# Patient Record
Sex: Female | Born: 1975 | Hispanic: No | Marital: Married
Health system: Southern US, Community
[De-identification: ages and names within clinical notes are randomized; demographics above are authoritative.]

---

## 2012-10-02 ENCOUNTER — Other Ambulatory Visit (HOSPITAL_COMMUNITY): Payer: Self-pay | Admitting: Obstetrics & Gynecology

## 2012-10-02 DIAGNOSIS — N979 Female infertility, unspecified: Secondary | ICD-10-CM

## 2012-10-02 DIAGNOSIS — Q506 Other congenital malformations of fallopian tube and broad ligament: Secondary | ICD-10-CM

## 2012-10-04 ENCOUNTER — Ambulatory Visit (HOSPITAL_COMMUNITY)
Admission: RE | Admit: 2012-10-04 | Discharge: 2012-10-04 | Disposition: A | Discharge status: Home / Self Care | Payer: BC Managed Care – PPO | Source: Ambulatory Visit | Attending: Obstetrics & Gynecology | Admitting: Obstetrics & Gynecology

## 2012-10-04 DIAGNOSIS — Q506 Other congenital malformations of fallopian tube and broad ligament: Secondary | ICD-10-CM

## 2012-10-04 DIAGNOSIS — N979 Female infertility, unspecified: Secondary | ICD-10-CM

## 2012-10-04 MED ORDER — IOHEXOL 300 MG/ML  SOLN
12.0000 mL | Freq: Once | INTRAMUSCULAR | Status: AC | PRN
  Administered 2012-10-04: 12 mL

## 2013-04-23 IMAGING — RF DG HYSTEROGRAM
8 series · 8 of 8 positions shown · IV contrast (omnipaque)
Comparison: none

CLINICAL DATA: Primary infertility.

HYSTEROSALPINGOGRAM
TECHNIQUE: Following cleansing of the cervix and vagina with
Betadine solution, a hysterosalpingogram was performed using a 5-
French hysterosalpingogram catheter and Omnipaque 300 contrast.
The patient tolerated the exam without difficulty.
Fluoroscopy time:  0.9 minutes

[Series 1: run · 1 of 1 slices shown (1 of 8)]
[im 1/1]
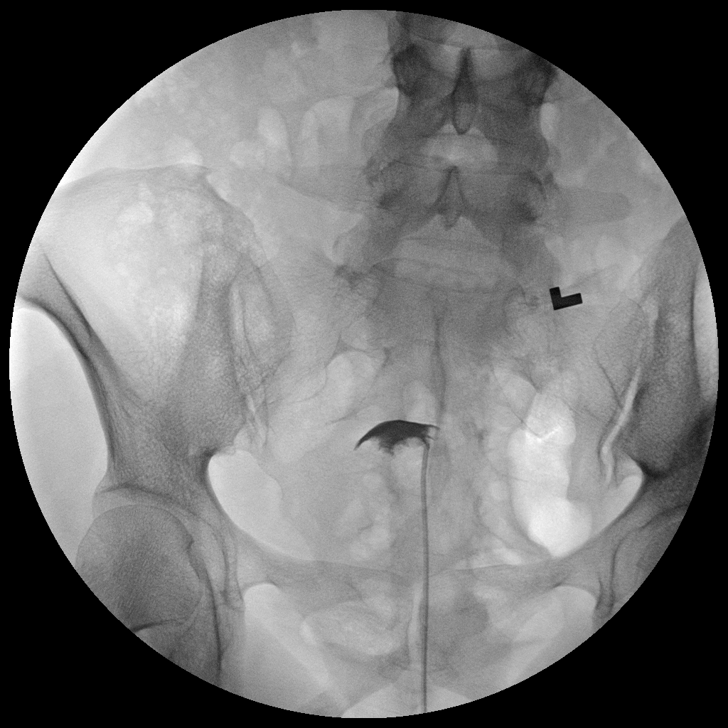

[Series 2: run · 1 of 1 slices shown (2 of 8)]
[im 1/1]
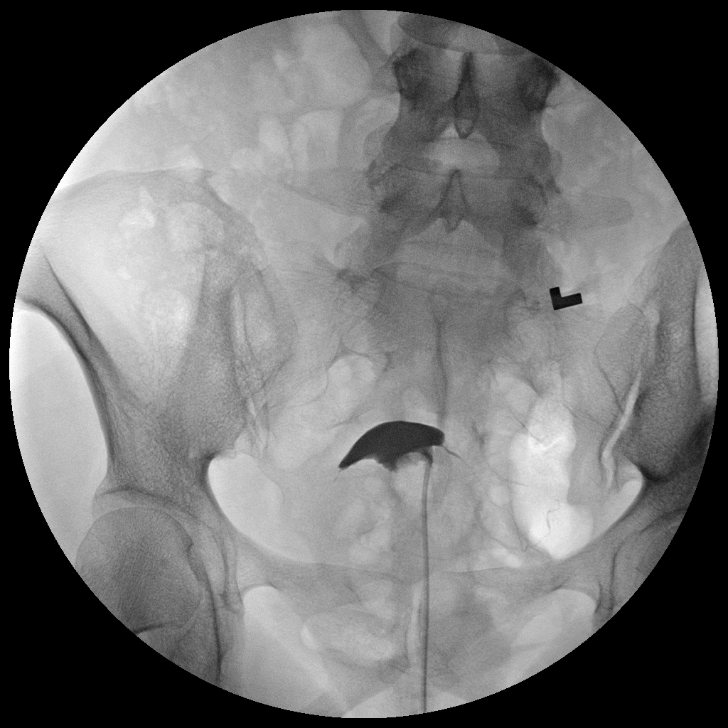

[Series 3: run · 1 of 1 slices shown (3 of 8)]
[im 1/1]
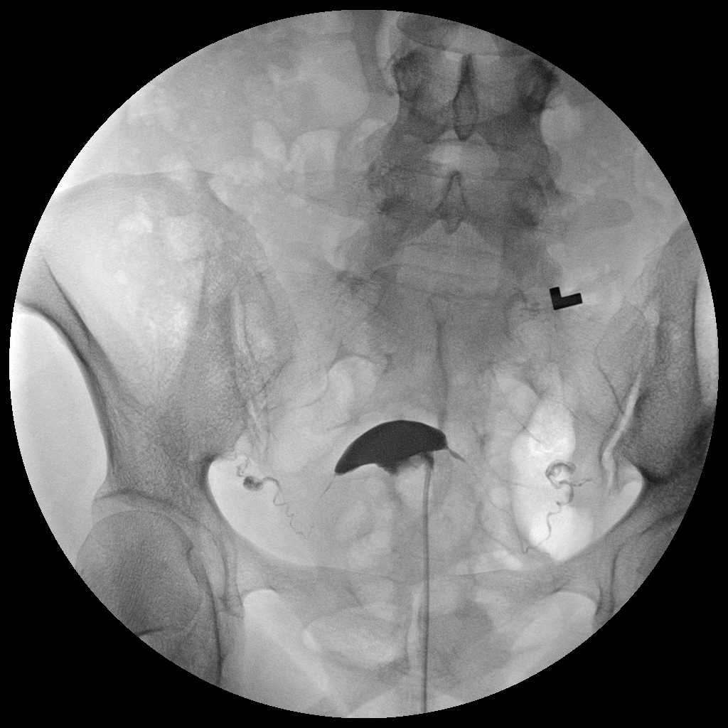

[Series 4: run · 1 of 1 slices shown (4 of 8)]
[im 1/1]
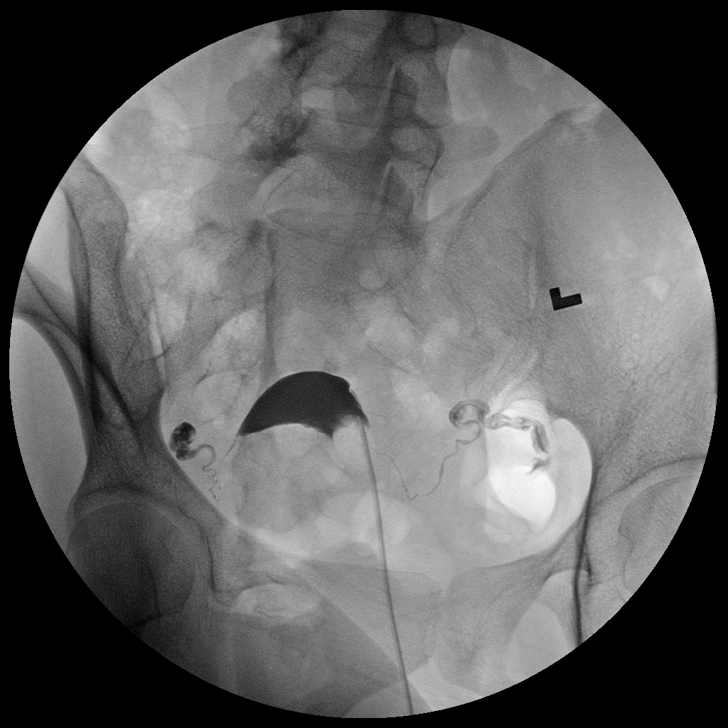

[Series 5: run · 1 of 1 slices shown (5 of 8)]
[im 1/1]
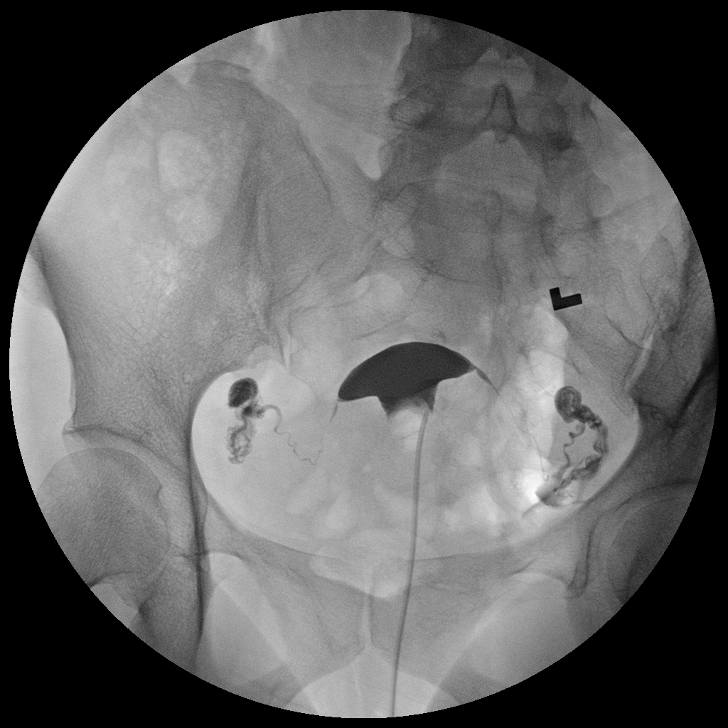

[Series 6: run · 1 of 1 slices shown (6 of 8)]
[im 1/1]
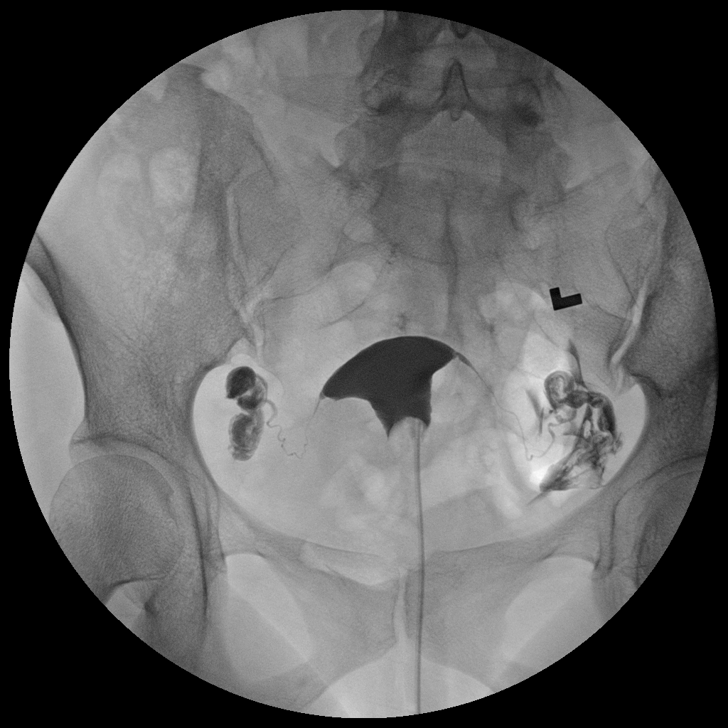

[Series 7: run · 1 of 1 slices shown (7 of 8)]
[im 1/1]
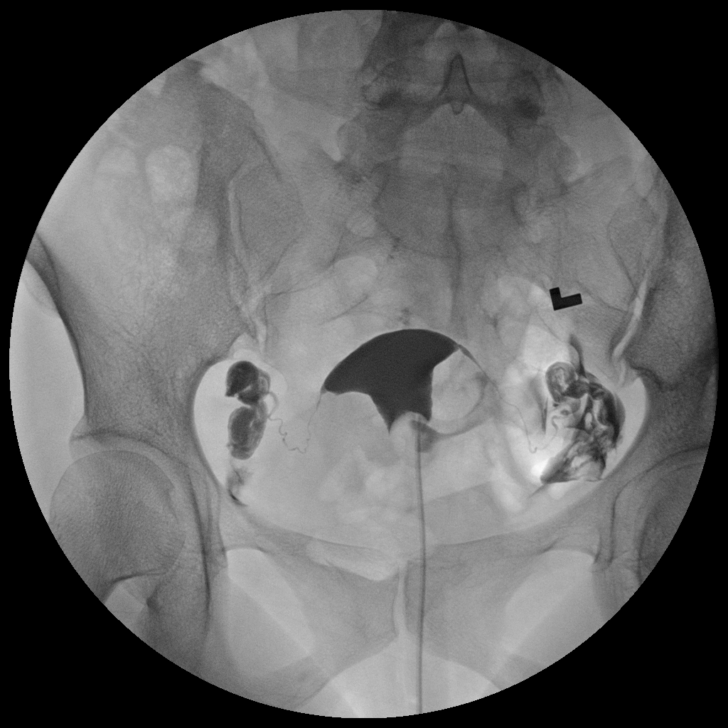

[Series 8: run · 1 of 1 slices shown (8 of 8)]
[im 1/1]
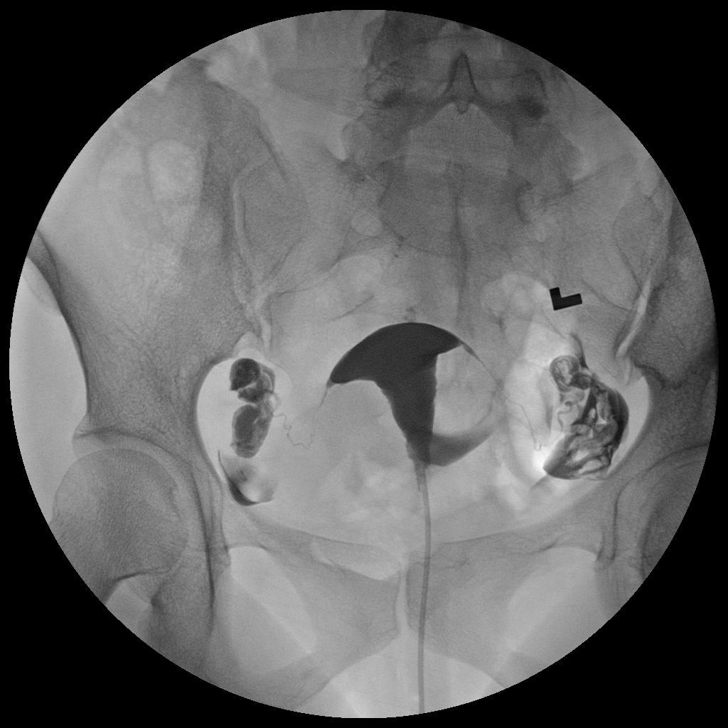

[8 of 8 positions shown; findings below may reference images not displayed]

FINDINGS: The endometrial cavity of the uterus is normal in contour
and appearance.

Contrast filling of both fallopian tubes is seen, and both tubes
are normal in appearance.  Intraperitoneal spill of contrast from
both fallopian tubes is demonstrated.
IMPRESSION: Normal study.  Fallopian tubes are patent bilaterally.

## 2013-08-23 ENCOUNTER — Other Ambulatory Visit: Payer: Self-pay | Admitting: Family Medicine

## 2013-12-16 ENCOUNTER — Other Ambulatory Visit: Payer: Self-pay

## 2014-03-20 ENCOUNTER — Other Ambulatory Visit (HOSPITAL_COMMUNITY): Payer: Self-pay | Admitting: Specialist

## 2014-03-20 DIAGNOSIS — IMO0001 Reserved for inherently not codable concepts without codable children: Secondary | ICD-10-CM

## 2014-03-20 DIAGNOSIS — O365920 Maternal care for other known or suspected poor fetal growth, second trimester, not applicable or unspecified: Secondary | ICD-10-CM

## 2014-03-20 DIAGNOSIS — O30002 Twin pregnancy, unspecified number of placenta and unspecified number of amniotic sacs, second trimester: Secondary | ICD-10-CM

## 2014-03-21 ENCOUNTER — Encounter (HOSPITAL_COMMUNITY): Payer: Self-pay

## 2014-03-21 ENCOUNTER — Other Ambulatory Visit (HOSPITAL_COMMUNITY): Payer: Self-pay | Admitting: Specialist

## 2014-03-21 ENCOUNTER — Ambulatory Visit (HOSPITAL_COMMUNITY)
Admission: RE | Admit: 2014-03-21 | Discharge: 2014-03-21 | Disposition: A | Discharge status: Home / Self Care | Payer: BC Managed Care – PPO | Source: Ambulatory Visit | Attending: Specialist | Admitting: Specialist

## 2014-03-21 DIAGNOSIS — IMO0001 Reserved for inherently not codable concepts without codable children: Secondary | ICD-10-CM

## 2014-03-21 DIAGNOSIS — O365920 Maternal care for other known or suspected poor fetal growth, second trimester, not applicable or unspecified: Secondary | ICD-10-CM

## 2014-03-21 DIAGNOSIS — O30002 Twin pregnancy, unspecified number of placenta and unspecified number of amniotic sacs, second trimester: Secondary | ICD-10-CM

## 2014-03-21 DIAGNOSIS — Z3689 Encounter for other specified antenatal screening: Secondary | ICD-10-CM | POA: Insufficient documentation

## 2014-03-21 DIAGNOSIS — O30009 Twin pregnancy, unspecified number of placenta and unspecified number of amniotic sacs, unspecified trimester: Secondary | ICD-10-CM | POA: Insufficient documentation

## 2014-03-28 ENCOUNTER — Other Ambulatory Visit (HOSPITAL_COMMUNITY): Payer: Self-pay | Admitting: Maternal and Fetal Medicine

## 2014-03-28 DIAGNOSIS — O30009 Twin pregnancy, unspecified number of placenta and unspecified number of amniotic sacs, unspecified trimester: Secondary | ICD-10-CM

## 2014-03-28 DIAGNOSIS — O43029 Fetus-to-fetus placental transfusion syndrome, unspecified trimester: Secondary | ICD-10-CM

## 2014-04-04 ENCOUNTER — Other Ambulatory Visit: Payer: Self-pay | Admitting: Obstetrics & Gynecology

## 2014-04-04 ENCOUNTER — Encounter (HOSPITAL_COMMUNITY): Payer: Self-pay

## 2014-04-04 ENCOUNTER — Ambulatory Visit (HOSPITAL_COMMUNITY)
Admission: RE | Admit: 2014-04-04 | Discharge: 2014-04-04 | Disposition: A | Discharge status: Home / Self Care | Payer: BC Managed Care – PPO | Source: Ambulatory Visit | Attending: Maternal and Fetal Medicine | Admitting: Maternal and Fetal Medicine

## 2014-04-04 ENCOUNTER — Other Ambulatory Visit (HOSPITAL_COMMUNITY): Payer: Self-pay | Admitting: Maternal and Fetal Medicine

## 2014-04-04 DIAGNOSIS — O30009 Twin pregnancy, unspecified number of placenta and unspecified number of amniotic sacs, unspecified trimester: Secondary | ICD-10-CM | POA: Insufficient documentation

## 2014-04-04 DIAGNOSIS — O368390 Maternal care for abnormalities of the fetal heart rate or rhythm, unspecified trimester, not applicable or unspecified: Secondary | ICD-10-CM | POA: Insufficient documentation

## 2014-04-04 DIAGNOSIS — O43029 Fetus-to-fetus placental transfusion syndrome, unspecified trimester: Secondary | ICD-10-CM

## 2014-04-04 DIAGNOSIS — O30049 Twin pregnancy, dichorionic/diamniotic, unspecified trimester: Secondary | ICD-10-CM | POA: Insufficient documentation

## 2014-04-04 DIAGNOSIS — O4100X0 Oligohydramnios, unspecified trimester, not applicable or unspecified: Secondary | ICD-10-CM | POA: Insufficient documentation

## 2014-04-04 NOTE — Progress Notes (Signed)
[facility] ultrasound  Indication: 38 yr old G1P0 at [redacted]w[redacted]d with IVF dichorionic/diamniotic twin gestation with twin A with growth restriction and oligohydramnios for BPP and Doppler studies.  Findings: 1. Dichorionic/diamniotic twin gestation; the dividing membrane is seen. 2. Both placentas are posterior; twin A placenta is a previa. 3. Twin A with oligohydramnios with MVP of 1cm; twin B with normal fluid. 4. Biophysical profile is 4/8 for twin A (-2 for breathing and -2 for fluid); BPP is 8/8 for twin B. 5. There is an arrhythmia in twin B most consistent with premature atrial contractions. 6. Both fetuses have intermittent elevated systolic/diastolic ratio on Doppler studies.  Recommendations: 1. Twin gestation with twin A growth restriction: - previously counseled - s/p betamethasone - given oligohydramnios and BPP 4/8 for twin A recommend admission with continuous fetal monitoring and repeat BPP and Doppler studies tomorrow; patient wishes to go to [facility] and will be admitted there - recommend delivery for nonreassuring fetal tracing 2. Recommend fetal growth in 1-2 weeks 3. s/p NICU consult 4. Has been ruled out for rupture of membranes 5. Twin B fetal arrhythmia: - discussed most likely PACs which are usually benign and resolve however a small percentange can develop tachycardia - recommend fetal echocardiogram asap - recommend surveillance of fetal heart tones  Eulis Foster, MD

## 2014-08-26 ENCOUNTER — Encounter (HOSPITAL_COMMUNITY): Payer: Self-pay

## 2015-01-24 ENCOUNTER — Encounter (HOSPITAL_COMMUNITY): Payer: Self-pay | Admitting: *Deleted
